# Patient Record
Sex: Female | Born: 1980 | Race: Black or African American | Hispanic: No | Marital: Single
Health system: Southern US, Community
[De-identification: ages and names within clinical notes are randomized; demographics above are authoritative.]

---

## 2002-11-01 ENCOUNTER — Emergency Department (HOSPITAL_COMMUNITY): Admission: EM | Admit: 2002-11-01 | Discharge: 2002-11-01 | Payer: Self-pay | Admitting: Emergency Medicine

## 2008-03-29 ENCOUNTER — Emergency Department (HOSPITAL_COMMUNITY): Admission: EM | Admit: 2008-03-29 | Discharge: 2008-03-29 | Payer: Self-pay | Admitting: Emergency Medicine

## 2008-12-12 ENCOUNTER — Encounter: Admission: RE | Admit: 2008-12-12 | Discharge: 2008-12-12 | Payer: Self-pay | Admitting: Infectious Diseases

## 2011-03-22 IMAGING — CR DG CHEST 1V
2 series · 2 of 2 positions shown · non-contrast
Comparison: None

CLINICAL DATA: Positive PPD.

CHEST - 1 VIEW

[view not recorded (1 of 2)]
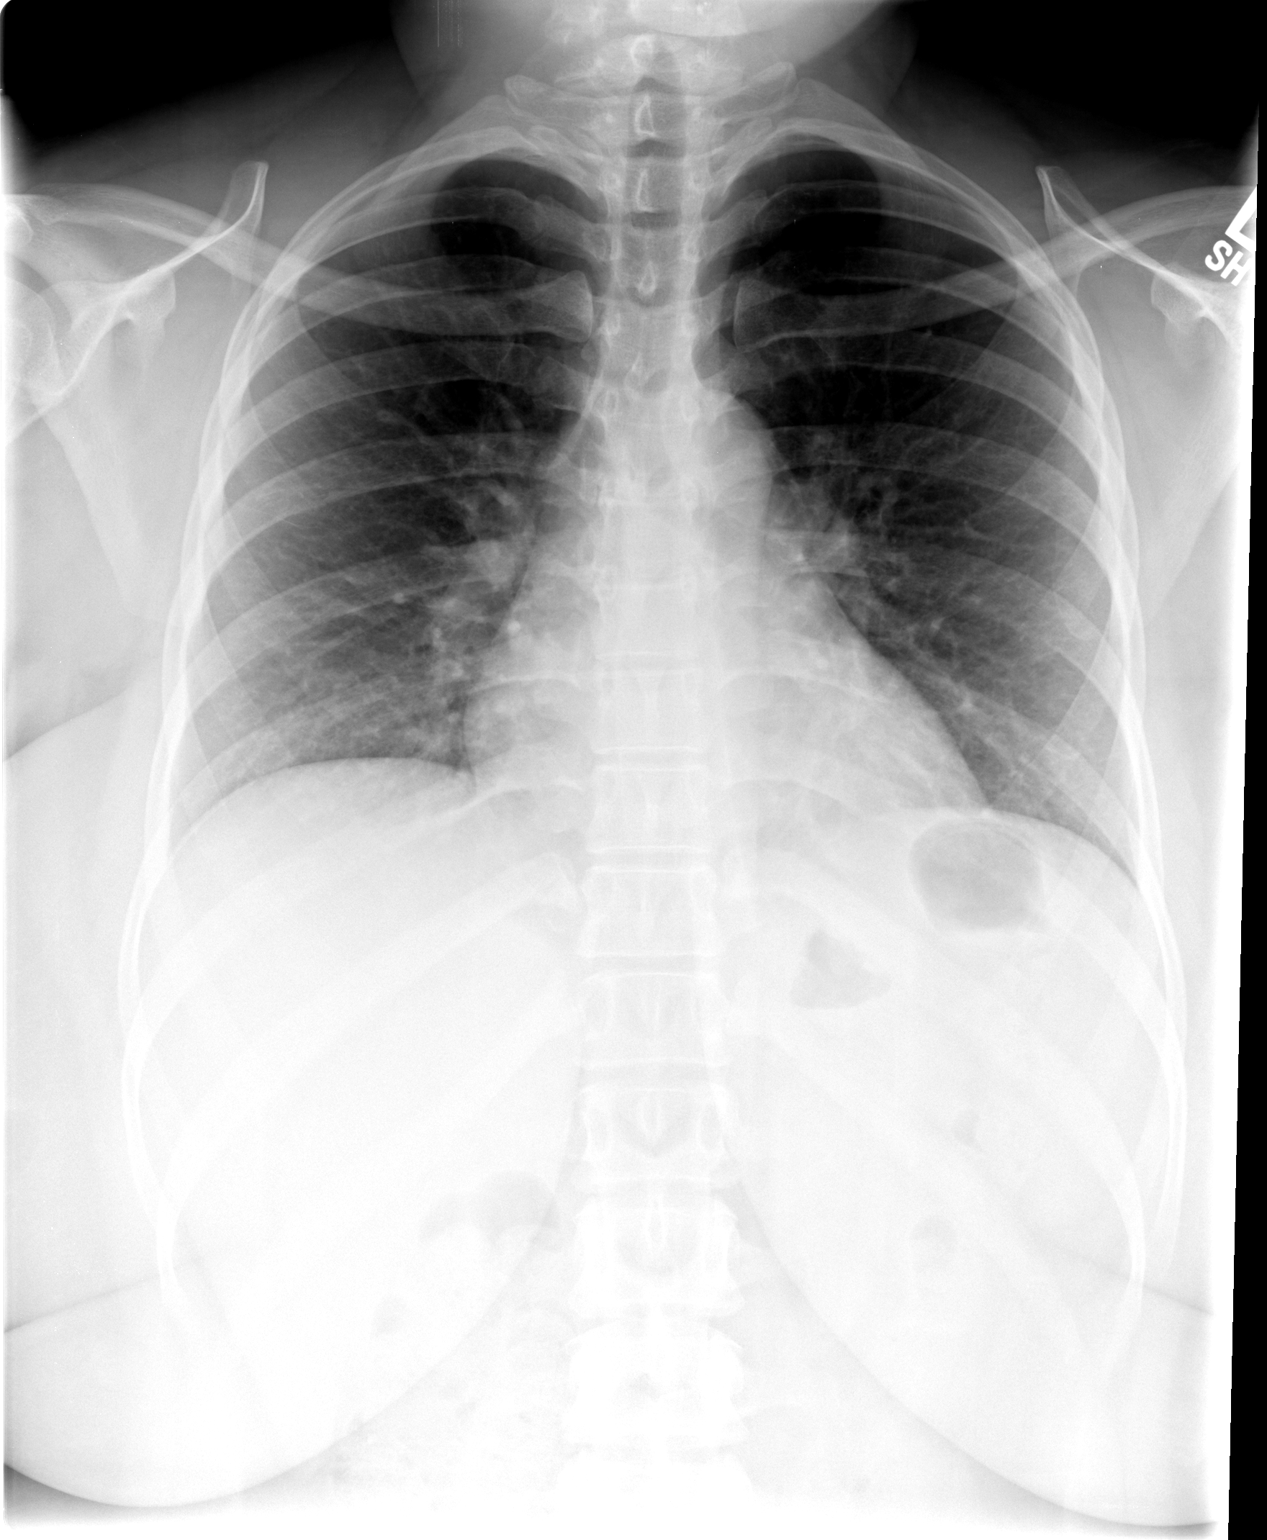

[view not recorded (2 of 2)]
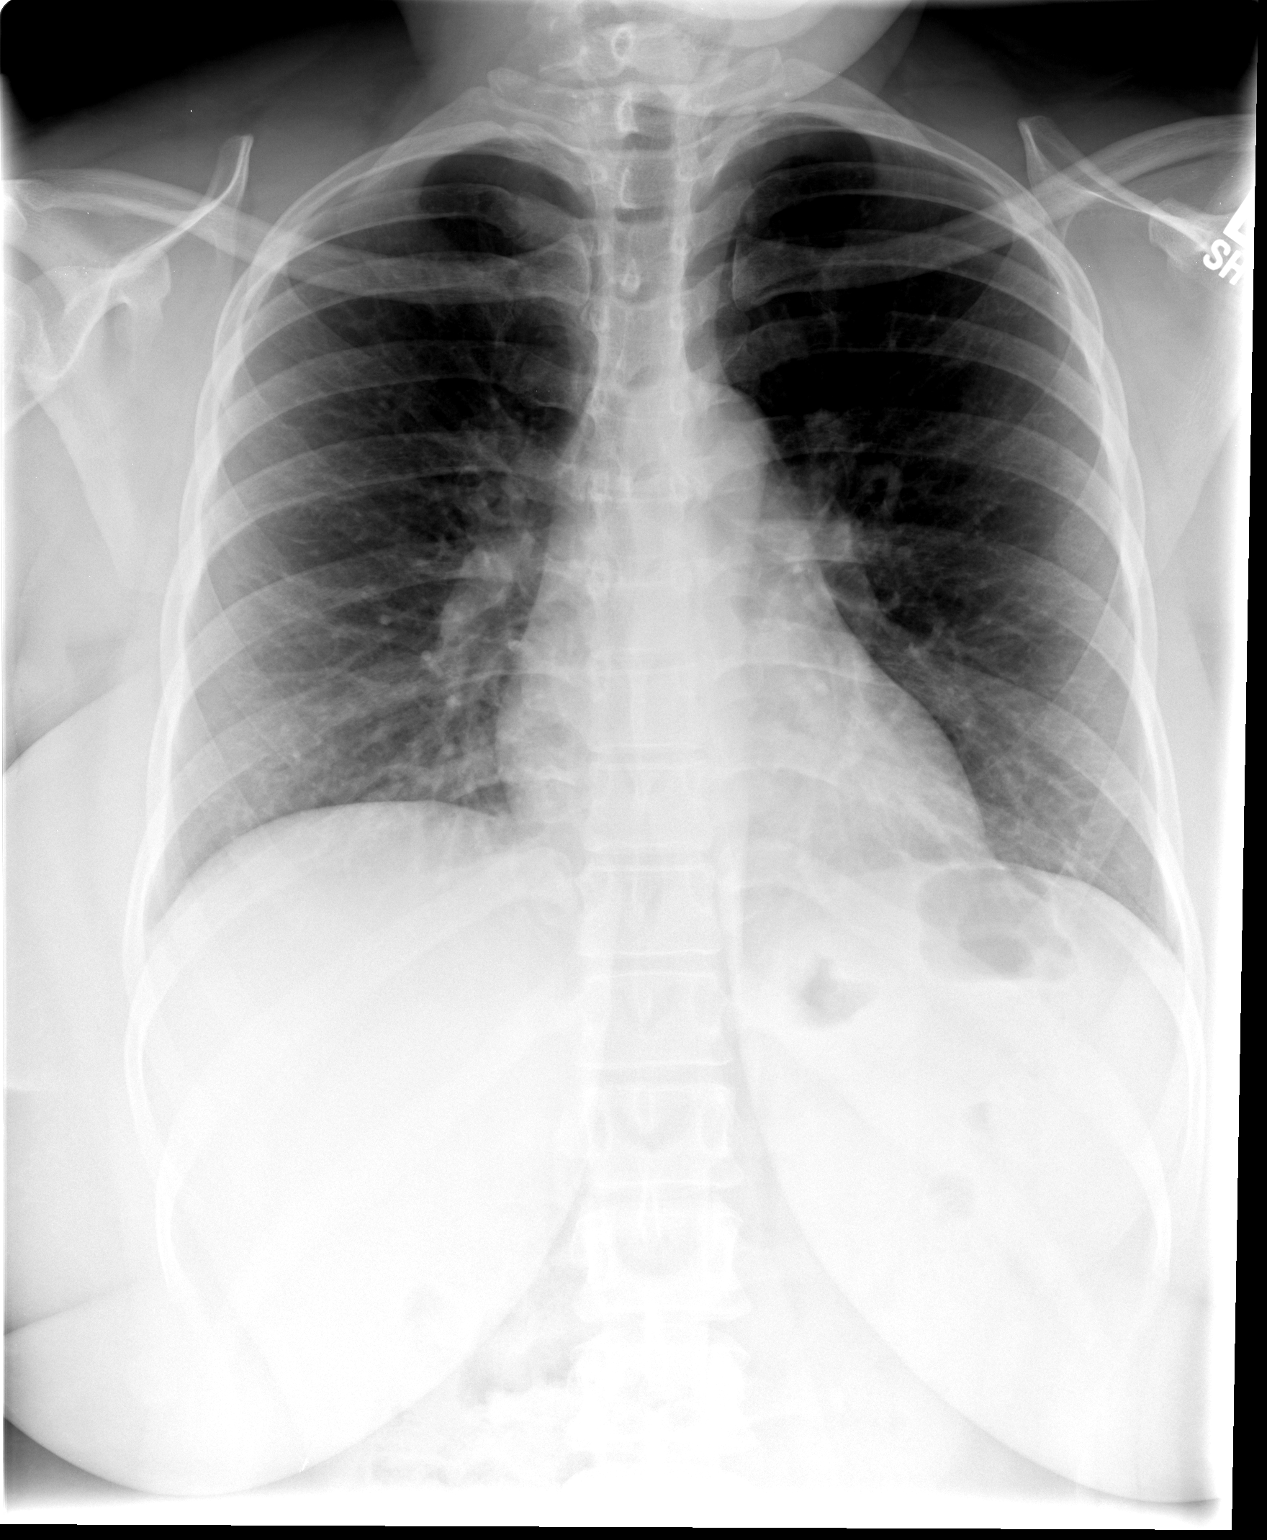

[2 of 2 positions shown; findings below may reference images not displayed]

FINDINGS: Heart size and mediastinal contours are normal.  Both
lungs are clear.  There is no evidence of pleural effusion.  No
mass or adenopathy identified.
IMPRESSION: No active disease.

## 2021-10-08 ENCOUNTER — Emergency Department (HOSPITAL_BASED_OUTPATIENT_CLINIC_OR_DEPARTMENT_OTHER): Payer: Self-pay

## 2021-10-08 ENCOUNTER — Other Ambulatory Visit: Payer: Self-pay

## 2021-10-08 ENCOUNTER — Encounter (HOSPITAL_BASED_OUTPATIENT_CLINIC_OR_DEPARTMENT_OTHER): Payer: Self-pay | Admitting: Pediatrics

## 2021-10-08 ENCOUNTER — Emergency Department (HOSPITAL_BASED_OUTPATIENT_CLINIC_OR_DEPARTMENT_OTHER)
Admission: EM | Admit: 2021-10-08 | Discharge: 2021-10-08 | Disposition: A | Discharge status: Home / Self Care | Payer: Self-pay | Attending: Emergency Medicine | Admitting: Emergency Medicine

## 2021-10-08 DIAGNOSIS — R030 Elevated blood-pressure reading, without diagnosis of hypertension: Secondary | ICD-10-CM

## 2021-10-08 DIAGNOSIS — R739 Hyperglycemia, unspecified: Secondary | ICD-10-CM

## 2021-10-08 DIAGNOSIS — R002 Palpitations: Secondary | ICD-10-CM

## 2021-10-08 LAB — CBC
HCT: 41.5 % (ref 36.0–46.0)
Hemoglobin: 13.6 g/dL (ref 12.0–15.0)
MCH: 26.3 pg (ref 26.0–34.0)
MCHC: 32.8 g/dL (ref 30.0–36.0)
MCV: 80.3 fL (ref 80.0–100.0)
Platelets: 361 10*3/uL (ref 150–400)
RBC: 5.17 MIL/uL — ABNORMAL HIGH (ref 3.87–5.11)
RDW: 15.9 % — ABNORMAL HIGH (ref 11.5–15.5)
WBC: 6.2 10*3/uL (ref 4.0–10.5)
nRBC: 0 % (ref 0.0–0.2)

## 2021-10-08 LAB — BASIC METABOLIC PANEL
Anion gap: 7 (ref 5–15)
BUN: 10 mg/dL (ref 6–20)
CO2: 23 mmol/L (ref 22–32)
Calcium: 9 mg/dL (ref 8.9–10.3)
Chloride: 107 mmol/L (ref 98–111)
Creatinine, Ser: 0.77 mg/dL (ref 0.44–1.00)
GFR, Estimated: >60 mL/min (ref >60)
Glucose, Bld: 196 mg/dL — ABNORMAL HIGH (ref 70–99)
Potassium: 4.1 mmol/L (ref 3.5–5.1)
Sodium: 137 mmol/L (ref 135–145)

## 2021-10-08 LAB — PREGNANCY, URINE: Preg Test, Ur: NEGATIVE

## 2021-10-08 NOTE — ED Notes (Signed)
Discharge instructions reviewed with patient. Patient verbalizes understanding, no further questions at this time. Medications band follow up information provided. No acute distress noted at time of departure.

## 2021-10-08 NOTE — ED Provider Notes (Signed)
[facility] EMERGENCY DEPARTMENT Provider Note   CSN: [id number] Arrival date & time: 10/08/21  9485     History  Chief Complaint  Patient presents with   Hypertension   Tachycardia    Lindsay Ponce is a 41 y.o. female.   Hypertension    Patient with no chronic medical conditions presents today due to tachycardia and high blood pressure.  Patient states she was sitting at home, her watch alerted her that her heart rate was in the 130s.  She was entirely asymptomatic, was concerned so called EMS.  Patient reports she has been under increased stress lately secondary to losing her job.  She takes a baby aspirin but no other medications.  Takes a baby aspirin because of a family history, has not prescribed it.  Denies specifically any chest pain, shortness of breath, vision changes, headaches, abdominal pain, nausea, vomiting, neck pain, lateralized weakness or numbness.  Home Medications Prior to Admission medications   Not on File      Allergies    Patient has no known allergies.    Review of Systems   Review of Systems  Physical Exam Updated Vital Signs BP (!) 156/106   Pulse 82   Temp 97.6 F (36.4 C) (Oral)   Resp 18   Ht 5\' 7"  (1.702 m)   Wt 99.8 kg   SpO2 100%   BMI 34.46 kg/m  Physical Exam Vitals and nursing note reviewed. Exam conducted with a chaperone present.  Constitutional:      Appearance: Normal appearance.  HENT:     Head: Normocephalic and atraumatic.  Eyes:     General: No scleral icterus.       Right eye: No discharge.        Left eye: No discharge.     Extraocular Movements: Extraocular movements intact.     Pupils: Pupils are equal, round, and reactive to light.  Cardiovascular:     Rate and Rhythm: Normal rate and regular rhythm.     Pulses: Normal pulses.     Heart sounds: Normal heart sounds. No murmur heard.    No friction rub. No gallop.  Pulmonary:     Effort: Pulmonary effort is normal. No respiratory distress.      Breath sounds: Normal breath sounds.  Abdominal:     General: Abdomen is flat. Bowel sounds are normal. There is no distension.     Palpations: Abdomen is soft.     Tenderness: There is no abdominal tenderness.  Skin:    General: Skin is warm and dry.     Coloration: Skin is not jaundiced.  Neurological:     Mental Status: She is alert. Mental status is at baseline.     Coordination: Coordination normal.     ED Results / Procedures / Treatments   Labs (all labs ordered are listed, but only abnormal results are displayed) Labs Reviewed  BASIC METABOLIC PANEL - Abnormal; Notable for the following components:      Result Value   Glucose, Bld 196 (*)    All other components within normal limits  CBC - Abnormal; Notable for the following components:   RBC 5.17 (*)    RDW 15.9 (*)    All other components within normal limits  PREGNANCY, URINE    EKG EKG Interpretation  Date/Time:  Wednesday October 08 2021 10:11:03 EDT Ventricular Rate:  86 PR Interval:  134 QRS Duration: 78 QT Interval:  360 QTC Calculation: 430 R Axis:  26 Text Interpretation: Normal sinus rhythm Normal ECG No previous ECGs available Confirmed by Ernie Avena ([id number]) on 10/08/2021 10:29:17 AM  Radiology DG Chest 2 View  Result Date: 10/08/2021 CLINICAL DATA:  Tachycardia.  Hypertension. EXAM: CHEST - 2 VIEW COMPARISON:  12/12/2008 FINDINGS: The examination is degraded due to patient body habitus. Unchanged cardiac silhouette and mediastinal contours. No focal parenchymal opacities. No pleural effusion or pneumothorax. No evidence of edema. No acute osseous abnormalities. IMPRESSION: No acute cardiopulmonary disease. Specifically, no evidence of edema. Electronically Signed   By: Simonne Come M.D.   On: 10/08/2021 10:26    Procedures Procedures    Medications Ordered in ED Medications - No data to display  ED Course/ Medical Decision Making/ A&P                           Medical Decision Making Amount  and/or Complexity of Data Reviewed Labs: ordered. Radiology: ordered.   Patient presents due to tachycardia.  Differential includes not limited to arrhythmia, electrolyte imbalance, anxiety, ACS, PE, hypertensive emergency.  On exam patient's lungs are clear to auscultation, no murmurs, rubs or gallops.  Regular rate and rhythm, lungs are clear to auscultation bilaterally.  Mentating well, abdomen soft nontender.  Patient is on cardiac monitoring is in sinus rhythm with a rate of 82.  I viewed the monitor in the room.  I also ordered an EKG which shows sinus rhythm without any underlying arrhythmia.  I ordered and checked basic labs.  Patient is not pregnant, no leukocytosis.  Patient is not anemic, no gross electrolyte derangement or AKI.  Sugar is slightly elevated at 196.  No history of diabetes.  There is no signs of renal impairment.  Patient denies any headache or vision changes, consider getting CT head given new hypertension but do not think indicated and very low suspicion for [facility].  Patient does not have any chest pain, normal EKG so do not think troponins or chest x-ray indicated.  On reevaluation patient is still in sinus rhythm.  She is asymptomatic.  Her blood pressure is elevated at 156/106, but otherwise vital signs are reassuring.  I do not see any signs of endorgan damage.  Patient does not have a history of hypertension, I do not think she needs to be started on antihypertensive from the emergency department.  I will give her referral to primary care doctor for reevaluation.  Problems Hyperglycemia-follow-up with PCP for recheck and hemoglobin A1c. Elevated blood pressure reading in the absence of hypertension diagnosis-elevated at 156/106.  Have this rechecked by primary.         Final Clinical Impression(s) / ED Diagnoses Final diagnoses:  Palpitations  Elevated BP without diagnosis of hypertension  Hyperglycemia    Rx / DC Orders ED Discharge Orders     None          Theron Arista, Cordelia Poche 10/08/21 1819    Ernie Avena, MD 10/08/21 1858

## 2021-10-08 NOTE — Discharge Instructions (Addendum)
Your was seen today in the emergency department for fast heart rate.  Your rhythm has been normal on the monitor.  Your blood work is also reassuring.  While I do not see anything emergent or life-threatening, I do think you should follow-up with a primary care doctor for reevaluation.  If you do not have 1 information above to establish care.  Specifically today you had an high glucose reading of 196.  This does not mean you are diabetic, just means you should have this rechecked by primary.  He shall have your blood pressure rechecked, it was 136/106 in the emergency department which is elevated.  If you have loss of consciousness, vision changes, new or worsening symptoms return back to ED for reevaluation.

## 2021-10-08 NOTE — ED Triage Notes (Signed)
Patient stated she noticed her heart rate was high along with high blood pressure; denies any history.

## 2021-10-08 NOTE — ED Notes (Signed)
Patient states she feels stressed since she lost her job recently and has had elevated BP. Denies any symptoms at this time other than feeling anxious. No pain/pressures or issues.

## 2021-12-01 ENCOUNTER — Ambulatory Visit: Payer: Self-pay | Admitting: Family Medicine

## 2021-12-23 ENCOUNTER — Inpatient Hospital Stay: Payer: Self-pay | Admitting: Family Medicine

## 2022-02-24 ENCOUNTER — Inpatient Hospital Stay: Payer: Self-pay | Admitting: Family Medicine
